# Patient Record
Sex: Female | Born: 1955 | Race: White | Hispanic: No | Marital: Married | Smoking: Never smoker
Health system: Southern US, Community
[De-identification: ages and names within clinical notes are randomized; demographics above are authoritative.]

## PROBLEM LIST (undated history)

## (undated) DIAGNOSIS — R251 Tremor, unspecified: Secondary | ICD-10-CM

## (undated) DIAGNOSIS — R238 Other skin changes: Secondary | ICD-10-CM

## (undated) DIAGNOSIS — E079 Disorder of thyroid, unspecified: Secondary | ICD-10-CM

## (undated) DIAGNOSIS — IMO0001 Reserved for inherently not codable concepts without codable children: Secondary | ICD-10-CM

## (undated) DIAGNOSIS — K589 Irritable bowel syndrome without diarrhea: Secondary | ICD-10-CM

## (undated) DIAGNOSIS — F32A Depression, unspecified: Secondary | ICD-10-CM

## (undated) DIAGNOSIS — K219 Gastro-esophageal reflux disease without esophagitis: Secondary | ICD-10-CM

## (undated) DIAGNOSIS — R5383 Other fatigue: Secondary | ICD-10-CM

## (undated) DIAGNOSIS — R233 Spontaneous ecchymoses: Secondary | ICD-10-CM

## (undated) DIAGNOSIS — F329 Major depressive disorder, single episode, unspecified: Secondary | ICD-10-CM

## (undated) DIAGNOSIS — R61 Generalized hyperhidrosis: Secondary | ICD-10-CM

## (undated) HISTORY — DX: Other fatigue: R53.83

## (undated) HISTORY — DX: Reserved for inherently not codable concepts without codable children: IMO0001

## (undated) HISTORY — DX: Depression, unspecified: F32.A

## (undated) HISTORY — PX: WRIST SURGERY: SHX841

## (undated) HISTORY — PX: TEMPOROMANDIBULAR JOINT SURGERY: SHX35

## (undated) HISTORY — PX: THYROID SURGERY: SHX805

## (undated) HISTORY — DX: Disorder of thyroid, unspecified: E07.9

## (undated) HISTORY — DX: Other skin changes: R23.8

## (undated) HISTORY — DX: Generalized hyperhidrosis: R61

## (undated) HISTORY — PX: ABDOMINAL HYSTERECTOMY: SHX81

## (undated) HISTORY — PX: CHOLECYSTECTOMY: SHX55

## (undated) HISTORY — DX: Tremor, unspecified: R25.1

## (undated) HISTORY — DX: Gastro-esophageal reflux disease without esophagitis: K21.9

## (undated) HISTORY — DX: Irritable bowel syndrome, unspecified: K58.9

## (undated) HISTORY — DX: Major depressive disorder, single episode, unspecified: F32.9

## (undated) HISTORY — PX: TONSILLECTOMY AND ADENOIDECTOMY: SUR1326

## (undated) HISTORY — PX: HERNIA REPAIR: SHX51

## (undated) HISTORY — DX: Spontaneous ecchymoses: R23.3

## (undated) HISTORY — DX: Irritable bowel syndrome without diarrhea: K58.9

---

## 2004-07-21 ENCOUNTER — Ambulatory Visit: Payer: Self-pay | Admitting: Internal Medicine

## 2004-07-26 ENCOUNTER — Ambulatory Visit: Payer: Self-pay | Admitting: Internal Medicine

## 2004-09-04 ENCOUNTER — Ambulatory Visit: Payer: Self-pay | Admitting: General Surgery

## 2006-05-14 ENCOUNTER — Ambulatory Visit: Payer: Self-pay | Admitting: Internal Medicine

## 2006-10-22 ENCOUNTER — Ambulatory Visit: Payer: Self-pay | Admitting: Unknown Physician Specialty

## 2007-04-21 ENCOUNTER — Ambulatory Visit: Payer: Self-pay | Admitting: Internal Medicine

## 2008-01-20 ENCOUNTER — Ambulatory Visit: Payer: Self-pay | Admitting: Obstetrics and Gynecology

## 2009-02-04 ENCOUNTER — Ambulatory Visit: Payer: Self-pay | Admitting: Obstetrics and Gynecology

## 2010-02-22 ENCOUNTER — Ambulatory Visit: Payer: Self-pay | Admitting: Obstetrics and Gynecology

## 2011-05-03 ENCOUNTER — Ambulatory Visit: Payer: Self-pay | Admitting: Internal Medicine

## 2012-05-13 ENCOUNTER — Ambulatory Visit: Payer: Self-pay | Admitting: Obstetrics and Gynecology

## 2013-05-14 ENCOUNTER — Ambulatory Visit: Payer: Self-pay | Admitting: Obstetrics and Gynecology

## 2013-08-18 ENCOUNTER — Ambulatory Visit: Payer: Self-pay | Admitting: Podiatry

## 2013-08-25 ENCOUNTER — Ambulatory Visit (INDEPENDENT_AMBULATORY_CARE_PROVIDER_SITE_OTHER): Payer: BC Managed Care – PPO | Admitting: Podiatry

## 2013-08-25 ENCOUNTER — Encounter: Payer: Self-pay | Admitting: Podiatry

## 2013-08-25 VITALS — BP 125/84 | HR 72 | Resp 16 | Ht 64.0 in | Wt 145.0 lb

## 2013-08-25 DIAGNOSIS — M722 Plantar fascial fibromatosis: Secondary | ICD-10-CM

## 2013-08-25 NOTE — Progress Notes (Signed)
Subjective:     Patient ID: Abigail Weaver, female   DOB: 10-24-1955, 57 y.o.   MRN: [id number]  HPI patient states that her pain is reducing and that she has different issues that she's on them too long. States that the thickness in her heels is improving   Review of Systems     Objective:   Physical Exam Neurovascular status intact with diminishment of plantar pain noted and thickness in the   plantar lateral heel improved Assessment:     Dermatitis thick skin and plantar fasciitis improved with orthotics and revitaderm    Plan:     Continue same skin protocol and orthotic usage. Discussed physical therapy stretching exercises and tight tissues that I think will be best for her. Reappoint as needed

## 2014-05-11 ENCOUNTER — Ambulatory Visit (INDEPENDENT_AMBULATORY_CARE_PROVIDER_SITE_OTHER): Payer: BC Managed Care – PPO | Admitting: Podiatry

## 2014-05-11 VITALS — BP 106/94 | HR 78 | Resp 16

## 2014-05-11 DIAGNOSIS — L600 Ingrowing nail: Secondary | ICD-10-CM

## 2014-05-11 DIAGNOSIS — B372 Candidiasis of skin and nail: Secondary | ICD-10-CM

## 2014-05-11 NOTE — Progress Notes (Signed)
Subjective:     Patient ID: Abigail Weaver, female   DOB: 1956-03-15, 58 y.o.   MRN: [id number]  HPI this big toenail is driving me crazy on my right foot and it's been this way since I've indicated and that simply is so sore that I cannot wear shoe gear comfortably. 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 She wants to procedure understanding risk and today I infiltrated 60 mg I can Marcaine mixture remove the nail entirely exposed the matrix and apply chemical consisting of phenol 5 applications and then alcohol lavaged and sterile dressing. Discussed her heels separately and she will try to utilize Vaseline under occlusion during the day and utilize read by did term which had been dispensed to her

## 2014-05-11 NOTE — Patient Instructions (Signed)

## 2014-05-13 ENCOUNTER — Telehealth: Payer: Self-pay | Admitting: *Deleted

## 2014-05-13 NOTE — Telephone Encounter (Signed)
This is not an emergency. I had a toenail removed.  I was coming by to buy a kit to take care of my toe and no one is here. Just wondering if you are going to be open tomorrow.  Please let me know.

## 2014-05-13 NOTE — Telephone Encounter (Signed)
Pt called wanting to know if we will be open tomorrow. Told pt we would be open 8 - 1 then close for lunch and reopen. Pt understood.

## 2014-05-14 ENCOUNTER — Ambulatory Visit: Payer: Self-pay | Admitting: Oral Surgery

## 2014-05-14 DIAGNOSIS — L600 Ingrowing nail: Secondary | ICD-10-CM

## 2014-05-20 ENCOUNTER — Telehealth: Payer: Self-pay | Admitting: Podiatry

## 2014-05-20 NOTE — Telephone Encounter (Signed)
Patient had toenails removed 2 weeks ago. She is going out of town and wants to know if it safe to be in a pool or lake. She is having some drainage, no scab but something is forming. She will be flying today and it is okay to leave the information on her voicemail.

## 2014-05-21 NOTE — Telephone Encounter (Signed)
CALLED AND SPOKE WITH PT PER DR REGAL PT WILL NEED TO SOAK HER TOE AFTER EACH SWIM. PT UNDERSTOOD.

## 2014-06-10 ENCOUNTER — Ambulatory Visit: Payer: Self-pay | Admitting: Obstetrics and Gynecology

## 2014-07-07 ENCOUNTER — Ambulatory Visit: Payer: Self-pay | Admitting: Neurology

## 2014-11-30 DIAGNOSIS — M79673 Pain in unspecified foot: Secondary | ICD-10-CM

## 2015-01-03 ENCOUNTER — Emergency Department
Admit: 2015-01-03 | Disposition: A | Discharge status: Home / Self Care | Payer: Self-pay | Admitting: Emergency Medicine

## 2015-06-13 ENCOUNTER — Other Ambulatory Visit: Payer: Self-pay | Admitting: Obstetrics and Gynecology

## 2015-06-13 DIAGNOSIS — Z1231 Encounter for screening mammogram for malignant neoplasm of breast: Secondary | ICD-10-CM

## 2015-06-23 ENCOUNTER — Other Ambulatory Visit: Payer: Self-pay | Admitting: Obstetrics and Gynecology

## 2015-06-23 ENCOUNTER — Ambulatory Visit
Admission: RE | Admit: 2015-06-23 | Discharge: 2015-06-23 | Disposition: A | Discharge status: Home / Self Care | Payer: BLUE CROSS/BLUE SHIELD | Source: Ambulatory Visit | Attending: Obstetrics and Gynecology | Admitting: Obstetrics and Gynecology

## 2015-06-23 DIAGNOSIS — Z1231 Encounter for screening mammogram for malignant neoplasm of breast: Secondary | ICD-10-CM | POA: Diagnosis present

## 2015-12-27 ENCOUNTER — Other Ambulatory Visit: Payer: Self-pay | Admitting: Obstetrics and Gynecology

## 2015-12-27 DIAGNOSIS — Z1231 Encounter for screening mammogram for malignant neoplasm of breast: Secondary | ICD-10-CM

## 2016-06-25 ENCOUNTER — Other Ambulatory Visit: Payer: Self-pay | Admitting: Obstetrics and Gynecology

## 2016-06-25 ENCOUNTER — Ambulatory Visit
Admission: RE | Admit: 2016-06-25 | Discharge: 2016-06-25 | Disposition: A | Discharge status: Home / Self Care | Payer: BLUE CROSS/BLUE SHIELD | Source: Ambulatory Visit | Attending: Obstetrics and Gynecology | Admitting: Obstetrics and Gynecology

## 2016-06-25 DIAGNOSIS — Z1231 Encounter for screening mammogram for malignant neoplasm of breast: Secondary | ICD-10-CM

## 2016-06-26 DIAGNOSIS — M79673 Pain in unspecified foot: Secondary | ICD-10-CM

## 2017-04-11 ENCOUNTER — Other Ambulatory Visit: Payer: Self-pay | Admitting: Obstetrics and Gynecology

## 2017-04-11 ENCOUNTER — Ambulatory Visit (INDEPENDENT_AMBULATORY_CARE_PROVIDER_SITE_OTHER): Payer: BLUE CROSS/BLUE SHIELD | Admitting: Podiatry

## 2017-04-11 DIAGNOSIS — M2010 Hallux valgus (acquired), unspecified foot: Secondary | ICD-10-CM

## 2017-04-11 DIAGNOSIS — B351 Tinea unguium: Secondary | ICD-10-CM | POA: Diagnosis not present

## 2017-04-11 DIAGNOSIS — Z1239 Encounter for other screening for malignant neoplasm of breast: Secondary | ICD-10-CM

## 2017-04-11 DIAGNOSIS — M21629 Bunionette of unspecified foot: Secondary | ICD-10-CM

## 2017-04-11 DIAGNOSIS — M79674 Pain in right toe(s): Secondary | ICD-10-CM

## 2017-04-11 NOTE — Progress Notes (Signed)
This patient presents the office with 2 concerns on her right foot.  For she says that her right great toenail has become snarly  following previous surgery in this office.  She says that she dropped an object on her toenail and it has become darkened since the time of injury.  She has no pain associated with this nail.  Her second concern is she is having pain on the outside ball of her right foot.  She points to a bony prominence noted at the head of the fifth metatarsal right foot.  She says this area becomes painful when she wears her walking shoes.  She presents the office today for an evaluation of these 2 concerns.   GENERAL APPEARANCE: Alert, conversant. Appropriately groomed. No acute distress.  VASCULAR: Pedal pulses are  palpable at  [facility] bilateral.  Capillary refill time is immediate to all digits,  Normal temperature gradient.   NEUROLOGIC: sensation is normal to 5.07 monofilament at 5/5 sites bilateral.  Light touch is intact bilateral, Muscle strength normal.  MUSCULOSKELETAL: acceptable muscle strength, tone and stability bilateral.  Intrinsic muscluature intact bilateral.  Rectus appearance of foot and digits noted bilateral. 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  Tailors bunion with prominence at the dorsolateral aspect of the fifth metatarsal right foot  DERMATOLOGIC: skin color, texture, and turgor are within normal limits.  No preulcerative lesions or ulcers  are seen, no interdigital maceration noted.  No open lesions present.  Digital nails are asymptomatic. There is a thickness that has developed at the site of the nail bed following surgery for the permanent removal of the right hallux toenail.   No drainage noted.  Diagnosis  onychomycosis right hallux   . Tailors bunion fifth MPJ right foot   ROV  discussed the conservative versus surgical treatment of a tailors bunion, right foot.  Debridement of thickened mycotic nail on the nailbed right hallux.  RTC  prn   Helane GuntherGregory Rayaan Lorah DPM

## 2017-07-02 ENCOUNTER — Ambulatory Visit
Admission: RE | Admit: 2017-07-02 | Discharge: 2017-07-02 | Disposition: A | Discharge status: Home / Self Care | Payer: BLUE CROSS/BLUE SHIELD | Source: Ambulatory Visit | Attending: Obstetrics and Gynecology | Admitting: Obstetrics and Gynecology

## 2017-07-02 DIAGNOSIS — Z1239 Encounter for other screening for malignant neoplasm of breast: Secondary | ICD-10-CM

## 2017-07-02 DIAGNOSIS — Z1231 Encounter for screening mammogram for malignant neoplasm of breast: Secondary | ICD-10-CM | POA: Insufficient documentation

## 2018-06-24 ENCOUNTER — Other Ambulatory Visit: Payer: Self-pay | Admitting: Obstetrics and Gynecology

## 2018-06-24 DIAGNOSIS — Z1231 Encounter for screening mammogram for malignant neoplasm of breast: Secondary | ICD-10-CM

## 2018-09-10 ENCOUNTER — Ambulatory Visit
Admission: RE | Admit: 2018-09-10 | Discharge: 2018-09-10 | Disposition: A | Discharge status: Home / Self Care | Payer: BLUE CROSS/BLUE SHIELD | Source: Ambulatory Visit | Attending: Obstetrics and Gynecology | Admitting: Obstetrics and Gynecology

## 2018-09-10 DIAGNOSIS — Z1231 Encounter for screening mammogram for malignant neoplasm of breast: Secondary | ICD-10-CM | POA: Insufficient documentation

## 2019-02-20 ENCOUNTER — Other Ambulatory Visit (HOSPITAL_COMMUNITY): Payer: Self-pay | Admitting: Neurology

## 2019-02-20 ENCOUNTER — Other Ambulatory Visit: Payer: Self-pay | Admitting: Neurology

## 2019-02-20 DIAGNOSIS — R4189 Other symptoms and signs involving cognitive functions and awareness: Secondary | ICD-10-CM

## 2019-03-19 ENCOUNTER — Ambulatory Visit
Admission: RE | Admit: 2019-03-19 | Discharge: 2019-03-19 | Disposition: A | Discharge status: Home / Self Care | Payer: BC Managed Care – PPO | Source: Ambulatory Visit | Attending: Neurology | Admitting: Neurology

## 2019-03-19 ENCOUNTER — Other Ambulatory Visit: Payer: Self-pay

## 2019-03-19 DIAGNOSIS — R4189 Other symptoms and signs involving cognitive functions and awareness: Secondary | ICD-10-CM | POA: Insufficient documentation

## 2019-03-19 MED ORDER — GADOBUTROL 1 MMOL/ML IV SOLN
6.0000 mL | Freq: Once | INTRAVENOUS | Status: AC | PRN
  Administered 2019-03-19: 16:00:00 6 mL via INTRAVENOUS

## 2019-09-02 ENCOUNTER — Other Ambulatory Visit: Payer: Self-pay | Admitting: Obstetrics and Gynecology

## 2019-09-02 DIAGNOSIS — Z1231 Encounter for screening mammogram for malignant neoplasm of breast: Secondary | ICD-10-CM

## 2019-09-15 ENCOUNTER — Ambulatory Visit
Admission: RE | Admit: 2019-09-15 | Discharge: 2019-09-15 | Disposition: A | Discharge status: Home / Self Care | Payer: BC Managed Care – PPO | Source: Ambulatory Visit | Attending: Obstetrics and Gynecology | Admitting: Obstetrics and Gynecology

## 2019-09-15 DIAGNOSIS — Z1231 Encounter for screening mammogram for malignant neoplasm of breast: Secondary | ICD-10-CM | POA: Insufficient documentation

## 2020-06-01 ENCOUNTER — Other Ambulatory Visit: Payer: Self-pay | Admitting: Obstetrics and Gynecology

## 2020-06-01 DIAGNOSIS — Z1231 Encounter for screening mammogram for malignant neoplasm of breast: Secondary | ICD-10-CM

## 2020-06-07 IMAGING — MR MRA HEAD WITHOUT CONTRAST
10 of 15 series · 27 of 48 positions shown · IV contrast (gadavist)
Comparison: 07/07/2014 trigeminal  MRI

CLINICAL DATA: Spells of difficulty speaking multiple times a day.

EXAM:
MRI HEAD WITHOUT AND WITH CONTRAST
MRA HEAD WITHOUT CONTRAST
TECHNIQUE: Multiplanar, multiecho pulse sequences of the brain and surrounding
structures were obtained without and with intravenous contrast.
Angiographic images of the head were obtained using MRA technique
without contrast.
CONTRAST:  6 cc Gadavist intravenous

[Series 5: ax dwi_tracew · axial · 3.0mm · 0.60mm/px · z∈[-52,+110]mm · 2 of 55 slices shown]
[im 1/55]
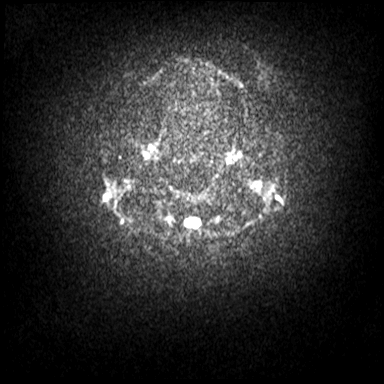
[im 55/55]
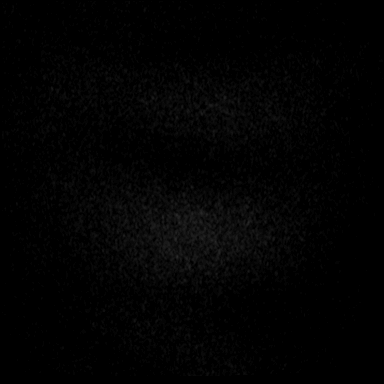

[Series 6: ax dwi_adc · axial · 3.0mm · 0.60mm/px · z∈[-52,+107]mm · 3 of 53 slices shown]
[im 1/53]
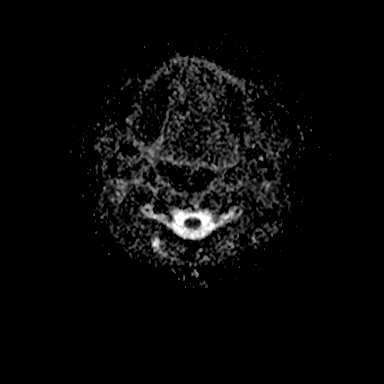
[im 27/53]
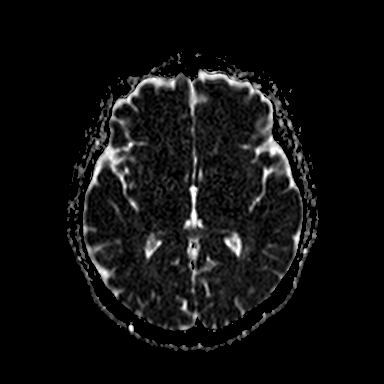
[im 53/53]
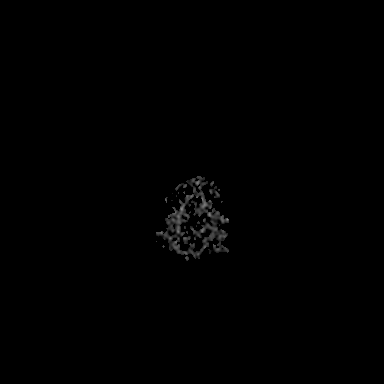

[Series 7: cor dwi_tracew · coronal · 5.0mm · 0.60mm/px · 2 of 45 slices shown]
[im 1/45]
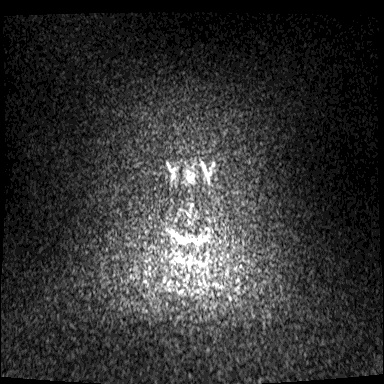
[im 45/45]
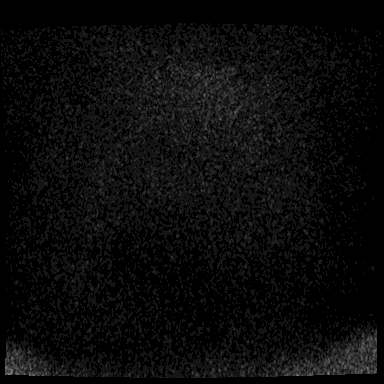

[Series 8: cor dwi_adc · coronal · 5.0mm · 0.60mm/px · 2 of 40 slices shown]
[im 1/40]
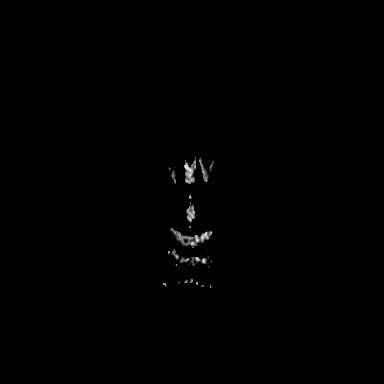
[im 40/40]
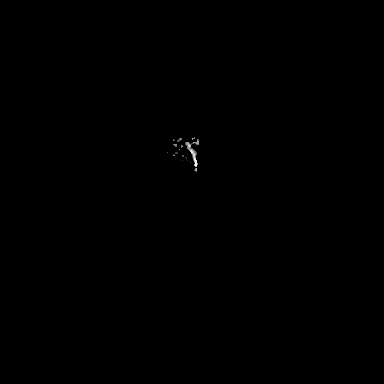

[Series 14: T1 · sagittal · 5.0mm · 0.62mm/px · 1 of 24 slices shown (1 of 2)]
[im 1/24]
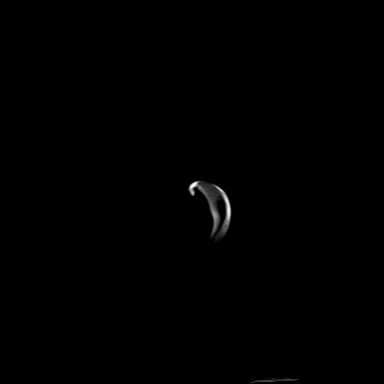

[Series 15: T2 · axial · 5.0mm · 0.53mm/px · 1 of 27 slices shown (1 of 2)]
[im 1/27]
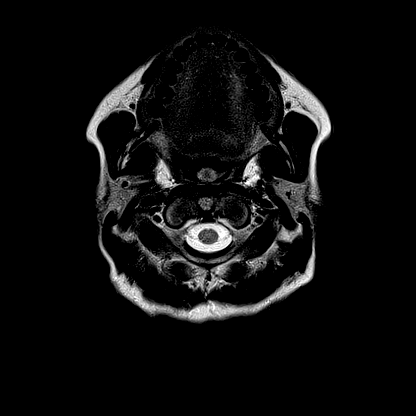

[Series 20: FLAIR · axial · 3.0mm · 0.53mm/px · z∈[-54,+108]mm · 3 of 55 slices shown (1 of 2)]
[im 1/55]
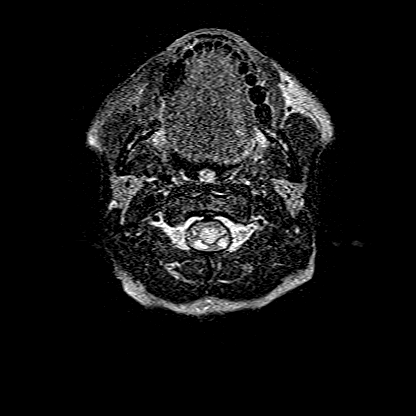
[im 28/55]
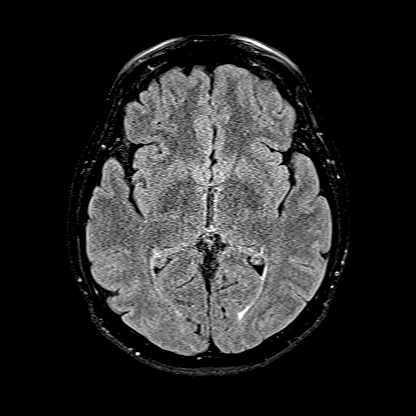
[im 55/55]
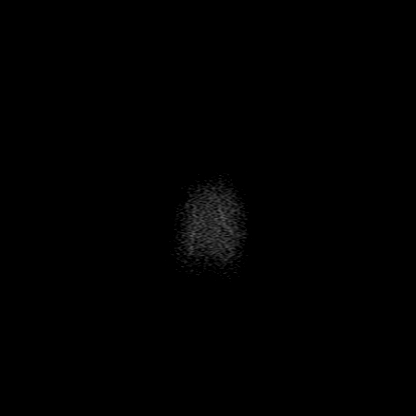

[Series 21: T1 · axial · 1.0mm · 0.98mm/px · z∈[-53,+121]mm · 8 of 174 slices shown (2 of 2)]
[im 1/174]
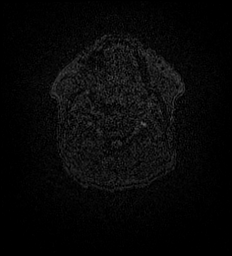
[im 22/174]
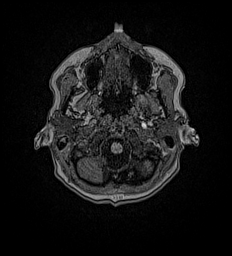
[im 44/174]
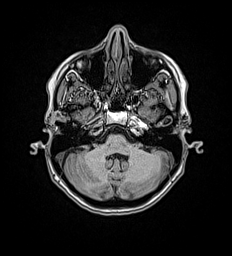
[im 65/174]
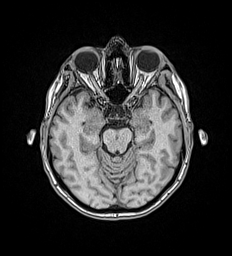
[im 109/174]
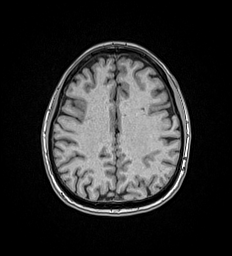
[im 130/174]
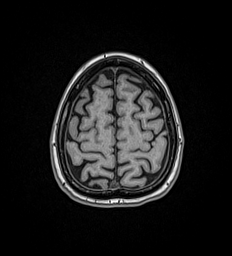
[im 152/174]
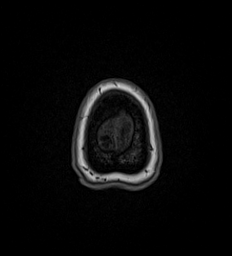
[im 174/174]
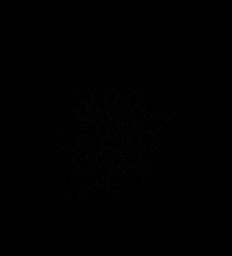

[Series 22: FLAIR · coronal · 3.0mm · 0.35mm/px · 2 of 35 slices shown (2 of 2)]
[im 1/35]
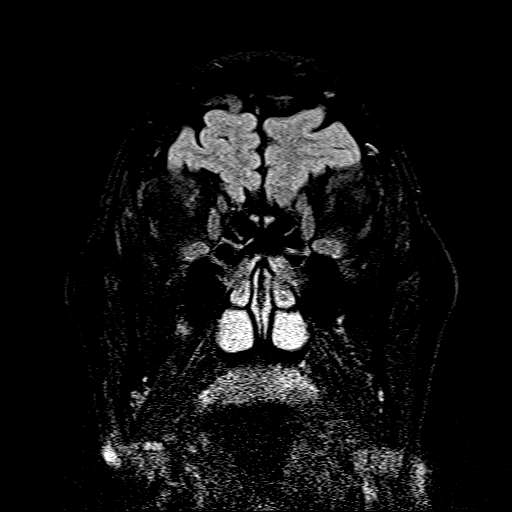
[im 35/35]
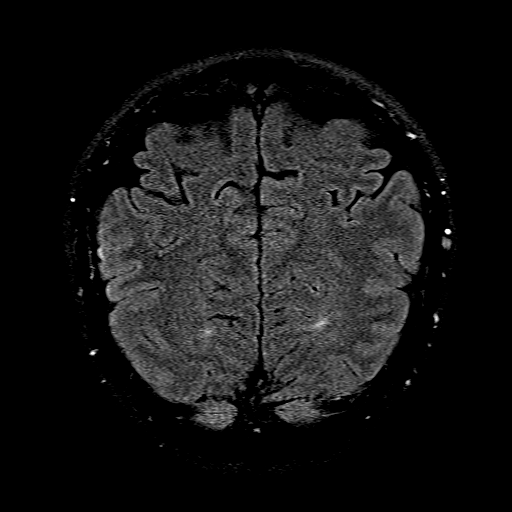

[Series 23: T2 · coronal · 3.0mm · 0.23mm/px · 3 of 56 slices shown (2 of 2)]
[im 1/56]
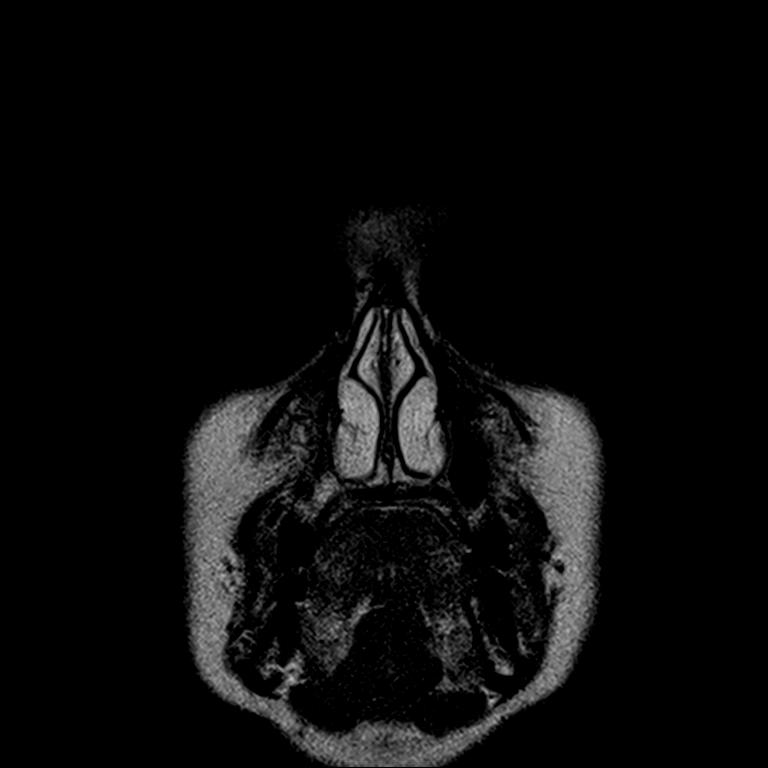
[im 28/56]
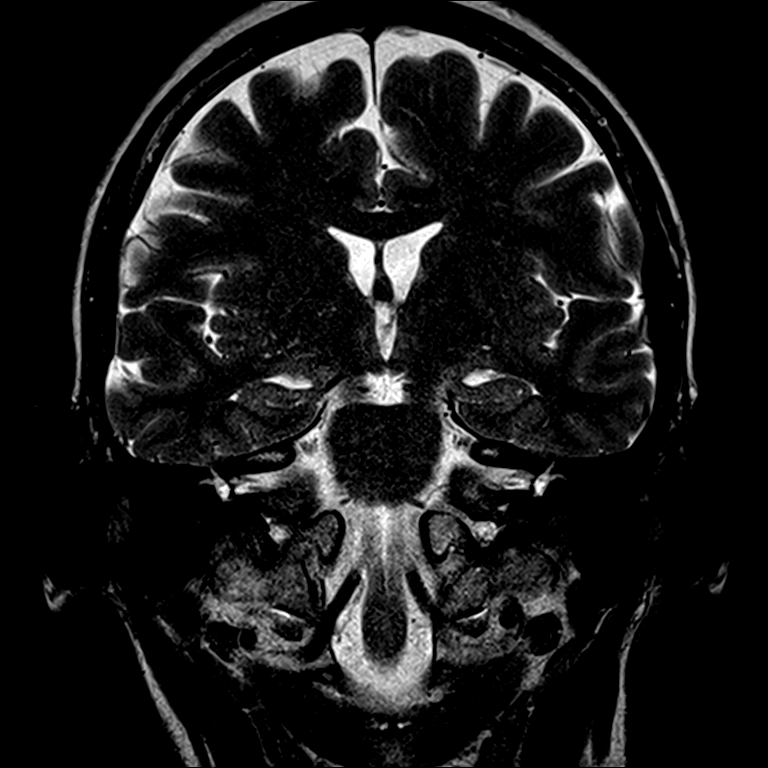
[im 56/56]
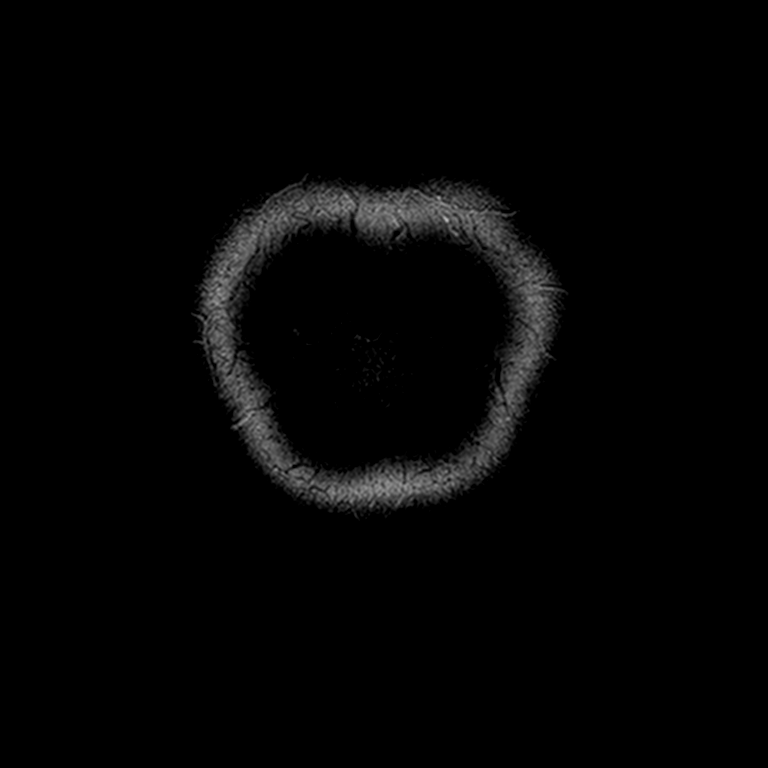

[27 of 48 positions shown; findings below may reference images not displayed]

FINDINGS: MRI HEAD FINDINGS

Brain: No infarction, hemorrhage, hydrocephalus, extra-axial
collection or mass lesion. No white matter disease, atrophy, or
abnormal enhancement. Coronal high-resolution T2 reveals no seizure
focus, including at the hippocampi.

Vascular: Arterial findings below. Normal dural venous sinus
appearance

Skull and upper cervical spine: Negative for marrow lesion. TMJ
arthropathy on both sides as previously described.

Sinuses/Orbits: Negative

MRA HEAD FINDINGS

Standard intracranial anatomy. Vessels are smooth and widely patent.
Negative for aneurysm.
IMPRESSION: Normal brain MRI and intracranial MRA.

## 2020-06-21 ENCOUNTER — Other Ambulatory Visit (HOSPITAL_COMMUNITY): Payer: Self-pay | Admitting: Neurology

## 2020-06-21 ENCOUNTER — Other Ambulatory Visit: Payer: Self-pay | Admitting: Neurology

## 2020-06-21 DIAGNOSIS — R413 Other amnesia: Secondary | ICD-10-CM

## 2020-07-04 ENCOUNTER — Ambulatory Visit (HOSPITAL_COMMUNITY): Admission: RE | Admit: 2020-07-04 | Payer: BC Managed Care – PPO | Source: Ambulatory Visit

## 2020-07-29 ENCOUNTER — Ambulatory Visit (HOSPITAL_COMMUNITY)
Admission: RE | Admit: 2020-07-29 | Discharge: 2020-07-29 | Disposition: A | Discharge status: Home / Self Care | Payer: BC Managed Care – PPO | Source: Ambulatory Visit | Attending: Neurology | Admitting: Neurology

## 2020-07-29 ENCOUNTER — Other Ambulatory Visit: Payer: Self-pay

## 2020-07-29 DIAGNOSIS — R413 Other amnesia: Secondary | ICD-10-CM | POA: Diagnosis not present

## 2020-08-17 ENCOUNTER — Ambulatory Visit
Admission: RE | Admit: 2020-08-17 | Discharge: 2020-08-17 | Disposition: A | Discharge status: Home / Self Care | Payer: BC Managed Care – PPO | Source: Ambulatory Visit | Attending: Obstetrics and Gynecology | Admitting: Obstetrics and Gynecology

## 2020-08-17 ENCOUNTER — Other Ambulatory Visit: Payer: Self-pay

## 2020-08-17 DIAGNOSIS — Z1231 Encounter for screening mammogram for malignant neoplasm of breast: Secondary | ICD-10-CM

## 2021-07-05 ENCOUNTER — Other Ambulatory Visit: Payer: Self-pay | Admitting: Obstetrics and Gynecology

## 2021-07-05 DIAGNOSIS — N644 Mastodynia: Secondary | ICD-10-CM

## 2021-07-19 ENCOUNTER — Other Ambulatory Visit: Payer: Self-pay

## 2021-07-19 ENCOUNTER — Ambulatory Visit
Admission: RE | Admit: 2021-07-19 | Discharge: 2021-07-19 | Disposition: A | Discharge status: Home / Self Care | Payer: BC Managed Care – PPO | Source: Ambulatory Visit | Attending: Obstetrics and Gynecology | Admitting: Obstetrics and Gynecology

## 2021-07-19 DIAGNOSIS — N644 Mastodynia: Secondary | ICD-10-CM

## 2022-06-19 ENCOUNTER — Other Ambulatory Visit: Payer: Self-pay | Admitting: Obstetrics and Gynecology

## 2022-06-19 DIAGNOSIS — Z1231 Encounter for screening mammogram for malignant neoplasm of breast: Secondary | ICD-10-CM

## 2022-07-24 ENCOUNTER — Ambulatory Visit
Admission: RE | Admit: 2022-07-24 | Discharge: 2022-07-24 | Disposition: A | Discharge status: Home / Self Care | Payer: Medicare Other | Source: Ambulatory Visit | Attending: Obstetrics and Gynecology | Admitting: Obstetrics and Gynecology

## 2022-07-24 DIAGNOSIS — Z1231 Encounter for screening mammogram for malignant neoplasm of breast: Secondary | ICD-10-CM | POA: Insufficient documentation

## 2023-07-29 ENCOUNTER — Other Ambulatory Visit: Payer: Self-pay | Admitting: Obstetrics and Gynecology

## 2023-07-29 DIAGNOSIS — Z1231 Encounter for screening mammogram for malignant neoplasm of breast: Secondary | ICD-10-CM

## 2023-08-21 ENCOUNTER — Ambulatory Visit
Admission: RE | Admit: 2023-08-21 | Discharge: 2023-08-21 | Disposition: A | Discharge status: Home / Self Care | Payer: Medicare Other | Source: Ambulatory Visit | Attending: Obstetrics and Gynecology | Admitting: Obstetrics and Gynecology

## 2023-08-21 DIAGNOSIS — Z1231 Encounter for screening mammogram for malignant neoplasm of breast: Secondary | ICD-10-CM | POA: Insufficient documentation

## 2024-08-06 ENCOUNTER — Other Ambulatory Visit: Payer: Self-pay | Admitting: Obstetrics and Gynecology

## 2024-08-06 DIAGNOSIS — Z1231 Encounter for screening mammogram for malignant neoplasm of breast: Secondary | ICD-10-CM

## 2024-09-07 ENCOUNTER — Other Ambulatory Visit: Payer: Self-pay | Admitting: Medical Genetics

## 2024-09-14 ENCOUNTER — Ambulatory Visit
Admission: RE | Admit: 2024-09-14 | Discharge: 2024-09-14 | Disposition: A | Discharge status: Home / Self Care | Source: Ambulatory Visit | Attending: Obstetrics and Gynecology | Admitting: Obstetrics and Gynecology

## 2024-09-14 ENCOUNTER — Inpatient Hospital Stay
Admission: RE | Admit: 2024-09-14 | Discharge: 2024-09-14 | Payer: Self-pay | Attending: Medical Genetics | Admitting: Medical Genetics

## 2024-09-14 DIAGNOSIS — Z1231 Encounter for screening mammogram for malignant neoplasm of breast: Secondary | ICD-10-CM | POA: Insufficient documentation

## 2024-10-02 LAB — GENECONNECT MOLECULAR SCREEN: Genetic Analysis Overall Interpretation: NEGATIVE
